# Patient Record
Sex: Female | Born: 1945 | Race: White | Hispanic: No | Marital: Single | Smoking: Never smoker
Health system: Southern US, Community
[De-identification: ages and names within clinical notes are randomized; demographics above are authoritative.]

## PROBLEM LIST (undated history)

## (undated) DIAGNOSIS — R0602 Shortness of breath: Secondary | ICD-10-CM

## (undated) DIAGNOSIS — I10 Essential (primary) hypertension: Secondary | ICD-10-CM

## (undated) DIAGNOSIS — E669 Obesity, unspecified: Secondary | ICD-10-CM

## (undated) DIAGNOSIS — C801 Malignant (primary) neoplasm, unspecified: Secondary | ICD-10-CM

## (undated) DIAGNOSIS — R42 Dizziness and giddiness: Secondary | ICD-10-CM

## (undated) DIAGNOSIS — E119 Type 2 diabetes mellitus without complications: Secondary | ICD-10-CM

## (undated) DIAGNOSIS — I2510 Atherosclerotic heart disease of native coronary artery without angina pectoris: Secondary | ICD-10-CM

## (undated) DIAGNOSIS — E039 Hypothyroidism, unspecified: Secondary | ICD-10-CM

## (undated) DIAGNOSIS — M1710 Unilateral primary osteoarthritis, unspecified knee: Secondary | ICD-10-CM

## (undated) DIAGNOSIS — E785 Hyperlipidemia, unspecified: Secondary | ICD-10-CM

## (undated) DIAGNOSIS — M179 Osteoarthritis of knee, unspecified: Secondary | ICD-10-CM

## (undated) HISTORY — PX: ABDOMINAL HYSTERECTOMY: SHX81

## (undated) HISTORY — PX: BREAST EXCISIONAL BIOPSY: SUR124

---

## 2004-01-11 ENCOUNTER — Ambulatory Visit: Payer: Self-pay | Admitting: Internal Medicine

## 2004-01-25 ENCOUNTER — Ambulatory Visit: Payer: Self-pay | Admitting: Internal Medicine

## 2005-02-12 ENCOUNTER — Ambulatory Visit: Payer: Self-pay | Admitting: Internal Medicine

## 2005-07-27 ENCOUNTER — Ambulatory Visit: Payer: Self-pay | Admitting: Gastroenterology

## 2005-08-28 ENCOUNTER — Ambulatory Visit: Payer: Self-pay

## 2006-02-15 ENCOUNTER — Ambulatory Visit: Payer: Self-pay

## 2006-03-25 ENCOUNTER — Ambulatory Visit: Payer: Self-pay | Admitting: Internal Medicine

## 2007-02-10 ENCOUNTER — Ambulatory Visit: Payer: Self-pay | Admitting: Internal Medicine

## 2007-03-28 ENCOUNTER — Ambulatory Visit: Payer: Self-pay | Admitting: Internal Medicine

## 2008-03-28 ENCOUNTER — Ambulatory Visit: Payer: Self-pay | Admitting: Internal Medicine

## 2008-12-03 ENCOUNTER — Ambulatory Visit: Payer: Self-pay | Admitting: Internal Medicine

## 2009-04-02 ENCOUNTER — Ambulatory Visit: Payer: Self-pay | Admitting: Internal Medicine

## 2009-05-29 ENCOUNTER — Ambulatory Visit: Payer: Self-pay | Admitting: Internal Medicine

## 2010-04-03 ENCOUNTER — Ambulatory Visit: Payer: Self-pay | Admitting: Internal Medicine

## 2011-03-13 ENCOUNTER — Ambulatory Visit: Payer: Self-pay | Admitting: Internal Medicine

## 2011-04-06 ENCOUNTER — Ambulatory Visit: Payer: Self-pay | Admitting: Internal Medicine

## 2012-04-15 ENCOUNTER — Ambulatory Visit: Payer: Self-pay | Admitting: Internal Medicine

## 2012-06-13 ENCOUNTER — Ambulatory Visit: Payer: Self-pay | Admitting: Internal Medicine

## 2013-04-17 ENCOUNTER — Ambulatory Visit: Payer: Self-pay | Admitting: Internal Medicine

## 2014-04-18 ENCOUNTER — Ambulatory Visit: Payer: Self-pay | Admitting: Internal Medicine

## 2015-04-04 ENCOUNTER — Encounter: Payer: Self-pay | Admitting: *Deleted

## 2015-04-05 ENCOUNTER — Ambulatory Visit: Payer: Medicare Other | Admitting: Anesthesiology

## 2015-04-05 ENCOUNTER — Encounter
Admission: RE | Disposition: A | Discharge status: Home / Self Care | Payer: Self-pay | Source: Ambulatory Visit | Attending: Gastroenterology

## 2015-04-05 ENCOUNTER — Encounter: Payer: Self-pay | Admitting: *Deleted

## 2015-04-05 ENCOUNTER — Ambulatory Visit
Admission: RE | Admit: 2015-04-05 | Discharge: 2015-04-05 | Disposition: A | Discharge status: Home / Self Care | Payer: Medicare Other | Source: Ambulatory Visit | Attending: Gastroenterology | Admitting: Gastroenterology

## 2015-04-05 DIAGNOSIS — Z7984 Long term (current) use of oral hypoglycemic drugs: Secondary | ICD-10-CM | POA: Insufficient documentation

## 2015-04-05 DIAGNOSIS — E039 Hypothyroidism, unspecified: Secondary | ICD-10-CM | POA: Insufficient documentation

## 2015-04-05 DIAGNOSIS — K529 Noninfective gastroenteritis and colitis, unspecified: Secondary | ICD-10-CM | POA: Diagnosis present

## 2015-04-05 DIAGNOSIS — E119 Type 2 diabetes mellitus without complications: Secondary | ICD-10-CM | POA: Insufficient documentation

## 2015-04-05 DIAGNOSIS — Z7982 Long term (current) use of aspirin: Secondary | ICD-10-CM | POA: Diagnosis not present

## 2015-04-05 DIAGNOSIS — I2510 Atherosclerotic heart disease of native coronary artery without angina pectoris: Secondary | ICD-10-CM | POA: Diagnosis not present

## 2015-04-05 DIAGNOSIS — Z6841 Body Mass Index (BMI) 40.0 and over, adult: Secondary | ICD-10-CM | POA: Insufficient documentation

## 2015-04-05 DIAGNOSIS — I10 Essential (primary) hypertension: Secondary | ICD-10-CM | POA: Insufficient documentation

## 2015-04-05 DIAGNOSIS — Z79899 Other long term (current) drug therapy: Secondary | ICD-10-CM | POA: Insufficient documentation

## 2015-04-05 DIAGNOSIS — D125 Benign neoplasm of sigmoid colon: Secondary | ICD-10-CM | POA: Insufficient documentation

## 2015-04-05 DIAGNOSIS — M19079 Primary osteoarthritis, unspecified ankle and foot: Secondary | ICD-10-CM | POA: Diagnosis not present

## 2015-04-05 DIAGNOSIS — K5730 Diverticulosis of large intestine without perforation or abscess without bleeding: Secondary | ICD-10-CM | POA: Diagnosis not present

## 2015-04-05 DIAGNOSIS — R42 Dizziness and giddiness: Secondary | ICD-10-CM | POA: Insufficient documentation

## 2015-04-05 DIAGNOSIS — D123 Benign neoplasm of transverse colon: Secondary | ICD-10-CM | POA: Insufficient documentation

## 2015-04-05 DIAGNOSIS — R0602 Shortness of breath: Secondary | ICD-10-CM | POA: Insufficient documentation

## 2015-04-05 DIAGNOSIS — Z90710 Acquired absence of both cervix and uterus: Secondary | ICD-10-CM | POA: Insufficient documentation

## 2015-04-05 DIAGNOSIS — E785 Hyperlipidemia, unspecified: Secondary | ICD-10-CM | POA: Insufficient documentation

## 2015-04-05 HISTORY — DX: Unilateral primary osteoarthritis, unspecified knee: M17.10

## 2015-04-05 HISTORY — DX: Obesity, unspecified: E66.9

## 2015-04-05 HISTORY — DX: Osteoarthritis of knee, unspecified: M17.9

## 2015-04-05 HISTORY — DX: Type 2 diabetes mellitus without complications: E11.9

## 2015-04-05 HISTORY — DX: Shortness of breath: R06.02

## 2015-04-05 HISTORY — DX: Hypothyroidism, unspecified: E03.9

## 2015-04-05 HISTORY — DX: Atherosclerotic heart disease of native coronary artery without angina pectoris: I25.10

## 2015-04-05 HISTORY — DX: Hyperlipidemia, unspecified: E78.5

## 2015-04-05 HISTORY — DX: Essential (primary) hypertension: I10

## 2015-04-05 HISTORY — DX: Dizziness and giddiness: R42

## 2015-04-05 HISTORY — PX: COLONOSCOPY WITH PROPOFOL: SHX5780

## 2015-04-05 LAB — GLUCOSE, CAPILLARY: GLUCOSE-CAPILLARY: 108 mg/dL — AB (ref 65–99)

## 2015-04-05 SURGERY — COLONOSCOPY WITH PROPOFOL
Anesthesia: General

## 2015-04-05 MED ORDER — PROPOFOL 10 MG/ML IV BOLUS
INTRAVENOUS | Status: DC | PRN
  Administered 2015-04-05: 50 mg via INTRAVENOUS

## 2015-04-05 MED ORDER — FENTANYL CITRATE (PF) 100 MCG/2ML IJ SOLN
INTRAMUSCULAR | Status: DC | PRN
  Administered 2015-04-05 (×4): 25 ug via INTRAVENOUS

## 2015-04-05 MED ORDER — SODIUM CHLORIDE 0.9 % IV SOLN
INTRAVENOUS | Status: DC
Start: 2015-04-05 — End: 2015-04-05
  Administered 2015-04-05: 08:00:00 via INTRAVENOUS

## 2015-04-05 MED ORDER — MIDAZOLAM HCL 2 MG/2ML IJ SOLN
INTRAMUSCULAR | Status: DC | PRN
  Administered 2015-04-05: 2 mg via INTRAVENOUS

## 2015-04-05 MED ORDER — PROPOFOL 500 MG/50ML IV EMUL
INTRAVENOUS | Status: DC | PRN
  Administered 2015-04-05: 100 ug/kg/min via INTRAVENOUS

## 2015-04-05 MED ORDER — PHENYLEPHRINE HCL 10 MG/ML IJ SOLN
INTRAMUSCULAR | Status: DC | PRN
  Administered 2015-04-05: 100 ug via INTRAVENOUS

## 2015-04-05 NOTE — H&P (Signed)
  Primary Care Physician:  Idelle Crouch, MD  Pre-Procedure History & Physical: HPI:  Amber Hines is a 69 y.o. female is here for an colonoscopy.   Past Medical History  Diagnosis Date  . Hypertension   . Hyperlipidemia   . SOB (shortness of breath)   . Dizziness and giddiness   . Osteoarthritis of knee   . Obesity   . Diabetes mellitus without complication ([place])   . Hypothyroidism   . Coronary artery disease     Past Surgical History  Procedure Laterality Date  . Abdominal hysterectomy      Prior to Admission medications   Medication Sig Start Date End Date Taking? Authorizing Provider  aspirin EC 81 MG tablet Take 81 mg by mouth daily.   Yes Historical Provider, MD  ezetimibe-simvastatin (VYTORIN) 10-20 MG tablet Take 1 tablet by mouth daily.   Yes Historical Provider, MD  levothyroxine (SYNTHROID, LEVOTHROID) 100 MCG tablet Take 100 mcg by mouth daily before breakfast.   Yes Historical Provider, MD  losartan-hydrochlorothiazide (HYZAAR) 50-12.5 MG tablet Take 1 tablet by mouth daily.   Yes Historical Provider, MD  metFORMIN (GLUCOPHAGE) 500 MG tablet Take by mouth daily.   Yes Historical Provider, MD  metoprolol succinate (TOPROL-XL) 50 MG 24 hr tablet Take 50 mg by mouth daily. Take with or immediately following a meal.   Yes Historical Provider, MD  omeprazole (PRILOSEC) 40 MG capsule Take 40 mg by mouth daily.   Yes Historical Provider, MD  pioglitazone (ACTOS) 15 MG tablet Take 15 mg by mouth daily.   Yes Historical Provider, MD    Allergies as of 02/27/2015  . (Not on File)    History reviewed. No pertinent family history.  Social History   Social History  . Marital Status: Single    Spouse Name: N/A  . Number of Children: N/A  . Years of Education: N/A   Occupational History  . Not on file.   Social History Main Topics  . Smoking status: Never Smoker   . Smokeless tobacco: Never Used  . Alcohol Use: No  . Drug Use: No  . Sexual Activity: Not  on file   Other Topics Concern  . Not on file   Social History Narrative     Physical Exam: BP 153/89 mmHg  Pulse 96  Temp(Src) 95.8 F (35.4 C) (Tympanic)  Resp 18  Ht 5\' 4"  (1.626 m)  Wt 110.678 kg (244 lb)  BMI 41.86 kg/m2  SpO2 100% General:   Alert,  pleasant and cooperative in NAD Head:  Normocephalic and atraumatic. Neck:  Supple; no masses or thyromegaly. Lungs:  Clear throughout to auscultation.    Heart:  Regular rate and rhythm. Abdomen:  Soft, nontender and nondistended. Normal bowel sounds, without guarding, and without rebound.   Neurologic:  Alert and  oriented x4;  grossly normal neurologically.  Impression/Plan: Amber Hines is here for an colonoscopy to be performed for chronic diarrhea.  Risks, benefits, limitations, and alternatives regarding  colonoscopy have been reviewed with the patient.  Questions have been answered.  All parties agreeable.   Josefine Class, MD  04/05/2015, 8:57 AM

## 2015-04-05 NOTE — Anesthesia Postprocedure Evaluation (Signed)
Anesthesia Post Note  Patient: Amber Hines  Procedure(s) Performed: Procedure(s) (LRB): COLONOSCOPY WITH PROPOFOL (N/A)  Patient location during evaluation: Endoscopy Anesthesia Type: General Level of consciousness: awake and alert Pain management: pain level controlled Vital Signs Assessment: post-procedure vital signs reviewed and stable Respiratory status: spontaneous breathing, nonlabored ventilation, respiratory function stable and patient connected to nasal cannula oxygen Cardiovascular status: blood pressure returned to baseline and stable Postop Assessment: no signs of nausea or vomiting Anesthetic complications: no    Last Vitals:  Filed Vitals:   04/05/15 1000 04/05/15 1010  BP: 113/77 124/80  Pulse: 65 58  Temp:    Resp: 14 15    Last Pain: There were no vitals filed for this visit.               Precious Haws Piscitello

## 2015-04-05 NOTE — Op Note (Signed)
[facility] Gastroenterology Patient Name: Amber Hines Procedure Date: 04/05/2015 8:59 AM MRN: [id number] Account #: [id number] Date of Birth: 1945/10/18 Admit Type: Outpatient Age: 69 Room: [facility] ENDO ROOM 1 Gender: Female Note Status: Finalized Procedure:         Colonoscopy Indications:       Last colonoscopy 10 years ago, Chronic diarrhea Patient Profile:   This is a 69 year old female. Providers:         Gerrit Heck. Rayann Heman, MD Referring MD:      Leonie Douglas. Doy Hutching, MD (Referring MD) Medicines:         Propofol per Anesthesia Complications:     No immediate complications. Procedure:         Pre-Anesthesia Assessment:                    - Prior to the procedure, a History and Physical was                     performed, and patient medications, allergies and                     sensitivities were reviewed. The patient's tolerance of                     previous anesthesia was reviewed.                    After obtaining informed consent, the colonoscope was                     passed under direct vision. Throughout the procedure, the                     patient's blood pressure, pulse, and oxygen saturations                     were monitored continuously. The Colonoscope was                     introduced through the anus and advanced to the the                     terminal ileum. The colonoscopy was performed without                     difficulty. The patient tolerated the procedure well. The                     quality of the bowel preparation was good. Findings:      The perianal and digital rectal examinations were normal.      A 4 mm polyp was found in the transverse colon. The polyp was sessile.       The polyp was removed with a cold snare. Resection and retrieval were       complete.      A 4 mm polyp was found in the sigmoid colon. The polyp was sessile. The       polyp was removed with a cold snare. Resection and retrieval were       complete.    A 3 mm polyp was found in the distal sigmoid colon. The polyp was       sessile. The polyp was removed with a jumbo cold forceps. Resection and       retrieval were complete.  A few small-mouthed diverticula were found in the sigmoid colon.      Biopsies for histology were taken with a cold forceps from the right       colon, left colon and rectum for evaluation of microscopic colitis.      The exam was otherwise without abnormality on direct and retroflexion       views.      The terminal ileum appeared normal. Impression:        - One 4 mm polyp in the transverse colon. Resected and                     retrieved.                    - One 4 mm polyp in the sigmoid colon. Resected and                     retrieved.                    - One 3 mm polyp in the distal sigmoid colon. Resected and                     retrieved.                    - Diverticulosis in the sigmoid colon.                    - The examination was otherwise normal on direct and                     retroflexion views.                    - The examined portion of the ileum was normal.                    - Biopsies were taken with a cold forceps from the right                     colon, left colon and rectum for evaluation of microscopic                     colitis. Recommendation:    - Observe patient in GI recovery unit.                    - Resume regular diet.                    - Continue present medications.                    - Await pathology results.                    - Repeat colonoscopy for surveillance based on pathology                     results.                    - Continue low fodmap diet and probiotic                    - Call GI clinic if stool frequency worsens.                    -  Return to referring physician.                    - The findings and recommendations were discussed with the                     patient.                    - The findings and recommendations were discussed  with the                     patient's family. Procedure Code(s): --- Professional ---                    [phone], Colonoscopy, flexible; with removal of tumor(s),                     polyp(s), or other lesion(s) by snare technique                    45380, 22, Colonoscopy, flexible; with biopsy, single or                     multiple Diagnosis Code(s): --- Professional ---                    D12.3, Benign neoplasm of transverse colon                    D12.5, Benign neoplasm of sigmoid colon                    K52.9, Noninfective gastroenteritis and colitis,                     unspecified                    K57.30, Diverticulosis of large intestine without                     perforation or abscess without bleeding CPT copyright 2014 American Medical Association. All rights reserved. The codes documented in this report are preliminary and upon coder review may  be revised to meet current compliance requirements. Mellody Life, MD 04/05/2015 9:31:37 AM This report has been signed electronically. Number of Addenda: 0 Note Initiated On: 04/05/2015 8:59 AM Scope Withdrawal Time: 0 hours 14 minutes 44 seconds  Total Procedure Duration: 0 hours 20 minutes 24 seconds       [facility]

## 2015-04-05 NOTE — OR Nursing (Signed)
BP 64/34 ON ARRIVAL . Ebony crna adm meds.

## 2015-04-05 NOTE — Discharge Instructions (Signed)

## 2015-04-05 NOTE — Transfer of Care (Signed)
Immediate Anesthesia Transfer of Care Note  Patient: Amber Hines  Procedure(s) Performed: Procedure(s): COLONOSCOPY WITH PROPOFOL (N/A)  Patient Location: PACU  Anesthesia Type:General  Level of Consciousness: awake, alert  and oriented  Airway & Oxygen Therapy: Patient Spontanous Breathing and Patient connected to nasal cannula oxygen  Post-op Assessment: Report given to RN and Post -op Vital signs reviewed and stable  Post vital signs: Reviewed and stable  Last Vitals:  Filed Vitals:   04/05/15 0806 04/05/15 0931  BP: 153/89   Pulse: 96   Temp: 35.4 C 35.7 C  Resp: 18     Complications: No apparent anesthesia complications

## 2015-04-05 NOTE — Anesthesia Preprocedure Evaluation (Signed)
Anesthesia Evaluation  Patient identified by MRN, date of birth, ID band Patient awake    Reviewed: Allergy & Precautions, H&P , NPO status , Patient's Chart, lab work & pertinent test results  History of Anesthesia Complications Negative for: history of anesthetic complications  Airway Mallampati: III  TM Distance: >3 FB Neck ROM: limited    Dental no notable dental hx. (+) Teeth Intact   Pulmonary neg pulmonary ROS, neg shortness of breath,    Pulmonary exam normal breath sounds clear to auscultation       Cardiovascular Exercise Tolerance: Good hypertension, (-) angina+ CAD  (-) Past MI and (-) DOE Normal cardiovascular exam Rhythm:regular Rate:Normal     Neuro/Psych negative neurological ROS  negative psych ROS   GI/Hepatic negative GI ROS, Neg liver ROS,   Endo/Other  diabetes, Type 2Hypothyroidism Morbid obesity  Renal/GU negative Renal ROS  negative genitourinary   Musculoskeletal   Abdominal   Peds  Hematology negative hematology ROS (+)   Anesthesia Other Findings Past Medical History:   Hypertension                                                 Hyperlipidemia                                               SOB (shortness of breath)                                    Dizziness and giddiness                                      Osteoarthritis of knee                                       Obesity                                                      Diabetes mellitus without complication (HCC)                 Hypothyroidism                                               Coronary artery disease                                     Past Surgical History:   ABDOMINAL HYSTERECTOMY  BMI    Body Mass Index   41.86 kg/m 2    Signs and symptoms suggestive of sleep apnea    Reproductive/Obstetrics negative OB ROS                              Anesthesia Physical Anesthesia Plan  ASA: III  Anesthesia Plan: General   Post-op Pain Management:    Induction:   Airway Management Planned:   Additional Equipment:   Intra-op Plan:   Post-operative Plan:   Informed Consent: I have reviewed the patients History and Physical, chart, labs and discussed the procedure including the risks, benefits and alternatives for the proposed anesthesia with the patient or authorized representative who has indicated his/her understanding and acceptance.   Dental Advisory Given  Plan Discussed with: Anesthesiologist, CRNA and Surgeon  Anesthesia Plan Comments:         Anesthesia Quick Evaluation

## 2015-04-09 ENCOUNTER — Other Ambulatory Visit: Payer: Self-pay | Admitting: Internal Medicine

## 2015-04-09 DIAGNOSIS — Z1231 Encounter for screening mammogram for malignant neoplasm of breast: Secondary | ICD-10-CM

## 2015-04-09 LAB — SURGICAL PATHOLOGY

## 2015-04-10 ENCOUNTER — Encounter: Payer: Self-pay | Admitting: Gastroenterology

## 2015-04-22 ENCOUNTER — Ambulatory Visit
Admission: RE | Admit: 2015-04-22 | Discharge: 2015-04-22 | Disposition: A | Discharge status: Home / Self Care | Payer: Medicare Other | Source: Ambulatory Visit | Attending: Internal Medicine | Admitting: Internal Medicine

## 2015-04-22 DIAGNOSIS — Z1231 Encounter for screening mammogram for malignant neoplasm of breast: Secondary | ICD-10-CM | POA: Diagnosis not present

## 2015-04-22 HISTORY — DX: Malignant (primary) neoplasm, unspecified: C80.1

## 2016-03-10 ENCOUNTER — Other Ambulatory Visit: Payer: Self-pay | Admitting: Internal Medicine

## 2016-03-10 DIAGNOSIS — Z1231 Encounter for screening mammogram for malignant neoplasm of breast: Secondary | ICD-10-CM

## 2016-04-23 ENCOUNTER — Ambulatory Visit: Payer: Medicare Other

## 2016-05-20 ENCOUNTER — Ambulatory Visit
Admission: RE | Admit: 2016-05-20 | Discharge: 2016-05-20 | Disposition: A | Discharge status: Home / Self Care | Payer: Medicare HMO | Source: Ambulatory Visit | Attending: Internal Medicine | Admitting: Internal Medicine

## 2016-05-20 DIAGNOSIS — Z1231 Encounter for screening mammogram for malignant neoplasm of breast: Secondary | ICD-10-CM | POA: Diagnosis present

## 2017-03-26 ENCOUNTER — Other Ambulatory Visit: Payer: Self-pay | Admitting: Internal Medicine

## 2017-03-26 DIAGNOSIS — Z1231 Encounter for screening mammogram for malignant neoplasm of breast: Secondary | ICD-10-CM

## 2017-05-24 ENCOUNTER — Ambulatory Visit
Admission: RE | Admit: 2017-05-24 | Discharge: 2017-05-24 | Disposition: A | Discharge status: Home / Self Care | Payer: Medicare HMO | Source: Ambulatory Visit | Attending: Internal Medicine | Admitting: Internal Medicine

## 2017-05-24 DIAGNOSIS — R928 Other abnormal and inconclusive findings on diagnostic imaging of breast: Secondary | ICD-10-CM | POA: Diagnosis not present

## 2017-05-24 DIAGNOSIS — Z1231 Encounter for screening mammogram for malignant neoplasm of breast: Secondary | ICD-10-CM | POA: Insufficient documentation

## 2017-05-26 ENCOUNTER — Other Ambulatory Visit: Payer: Self-pay | Admitting: Internal Medicine

## 2017-05-26 DIAGNOSIS — N6310 Unspecified lump in the right breast, unspecified quadrant: Secondary | ICD-10-CM

## 2017-05-26 DIAGNOSIS — R928 Other abnormal and inconclusive findings on diagnostic imaging of breast: Secondary | ICD-10-CM

## 2017-06-03 ENCOUNTER — Ambulatory Visit
Admission: RE | Admit: 2017-06-03 | Discharge: 2017-06-03 | Disposition: A | Discharge status: Home / Self Care | Payer: Medicare HMO | Source: Ambulatory Visit | Attending: Internal Medicine | Admitting: Internal Medicine

## 2017-06-03 DIAGNOSIS — N6310 Unspecified lump in the right breast, unspecified quadrant: Secondary | ICD-10-CM | POA: Insufficient documentation

## 2017-06-03 DIAGNOSIS — R928 Other abnormal and inconclusive findings on diagnostic imaging of breast: Secondary | ICD-10-CM | POA: Diagnosis present

## 2018-05-09 ENCOUNTER — Other Ambulatory Visit: Payer: Self-pay | Admitting: Internal Medicine

## 2018-05-09 DIAGNOSIS — N6310 Unspecified lump in the right breast, unspecified quadrant: Secondary | ICD-10-CM

## 2018-05-25 ENCOUNTER — Ambulatory Visit
Admission: RE | Admit: 2018-05-25 | Discharge: 2018-05-25 | Disposition: A | Discharge status: Home / Self Care | Payer: Medicare HMO | Source: Ambulatory Visit | Attending: Internal Medicine | Admitting: Internal Medicine

## 2018-05-25 DIAGNOSIS — N6489 Other specified disorders of breast: Secondary | ICD-10-CM | POA: Diagnosis not present

## 2018-05-25 DIAGNOSIS — R922 Inconclusive mammogram: Secondary | ICD-10-CM | POA: Diagnosis not present

## 2018-05-25 DIAGNOSIS — N6310 Unspecified lump in the right breast, unspecified quadrant: Secondary | ICD-10-CM

## 2019-04-25 ENCOUNTER — Other Ambulatory Visit: Payer: Self-pay | Admitting: Internal Medicine

## 2019-04-25 DIAGNOSIS — N63 Unspecified lump in unspecified breast: Secondary | ICD-10-CM

## 2019-04-25 DIAGNOSIS — Z1231 Encounter for screening mammogram for malignant neoplasm of breast: Secondary | ICD-10-CM

## 2019-05-29 ENCOUNTER — Ambulatory Visit
Admission: RE | Admit: 2019-05-29 | Discharge: 2019-05-29 | Disposition: A | Discharge status: Home / Self Care | Payer: Medicare HMO | Source: Ambulatory Visit | Attending: Internal Medicine | Admitting: Internal Medicine

## 2019-05-29 DIAGNOSIS — N6315 Unspecified lump in the right breast, overlapping quadrants: Secondary | ICD-10-CM | POA: Diagnosis not present

## 2019-05-29 DIAGNOSIS — N63 Unspecified lump in unspecified breast: Secondary | ICD-10-CM

## 2019-05-29 DIAGNOSIS — Z1231 Encounter for screening mammogram for malignant neoplasm of breast: Secondary | ICD-10-CM

## 2019-07-06 ENCOUNTER — Other Ambulatory Visit: Payer: Self-pay

## 2019-07-06 ENCOUNTER — Emergency Department
Admission: EM | Admit: 2019-07-06 | Discharge: 2019-07-06 | Disposition: A | Discharge status: Home / Self Care | Payer: Medicare HMO | Attending: Emergency Medicine | Admitting: Emergency Medicine

## 2019-07-06 DIAGNOSIS — E119 Type 2 diabetes mellitus without complications: Secondary | ICD-10-CM | POA: Insufficient documentation

## 2019-07-06 DIAGNOSIS — Z7982 Long term (current) use of aspirin: Secondary | ICD-10-CM | POA: Diagnosis not present

## 2019-07-06 DIAGNOSIS — I10 Essential (primary) hypertension: Secondary | ICD-10-CM | POA: Insufficient documentation

## 2019-07-06 DIAGNOSIS — Z7984 Long term (current) use of oral hypoglycemic drugs: Secondary | ICD-10-CM | POA: Diagnosis not present

## 2019-07-06 DIAGNOSIS — R55 Syncope and collapse: Secondary | ICD-10-CM | POA: Diagnosis not present

## 2019-07-06 DIAGNOSIS — Z79899 Other long term (current) drug therapy: Secondary | ICD-10-CM | POA: Diagnosis not present

## 2019-07-06 LAB — CBC
HCT: 39.3 % (ref 36.0–46.0)
Hemoglobin: 13.2 g/dL (ref 12.0–15.0)
MCH: 30.4 pg (ref 26.0–34.0)
MCHC: 33.6 g/dL (ref 30.0–36.0)
MCV: 90.6 fL (ref 80.0–100.0)
Platelets: 204 10*3/uL (ref 150–400)
RBC: 4.34 MIL/uL (ref 3.87–5.11)
RDW: 13.2 % (ref 11.5–15.5)
WBC: 6.1 10*3/uL (ref 4.0–10.5)
nRBC: 0 % (ref 0.0–0.2)

## 2019-07-06 LAB — BASIC METABOLIC PANEL
Anion gap: 12 (ref 5–15)
BUN: 10 mg/dL (ref 8–23)
CO2: 27 mmol/L (ref 22–32)
Calcium: 9 mg/dL (ref 8.9–10.3)
Chloride: 96 mmol/L — ABNORMAL LOW (ref 98–111)
Creatinine, Ser: 0.89 mg/dL (ref 0.44–1.00)
GFR calc Af Amer: >60 mL/min (ref >60)
GFR calc non Af Amer: >60 mL/min (ref >60)
Glucose, Bld: 153 mg/dL — ABNORMAL HIGH (ref 70–99)
Potassium: 3.8 mmol/L (ref 3.5–5.1)
Sodium: 135 mmol/L (ref 135–145)

## 2019-07-06 MED ORDER — SODIUM CHLORIDE 0.9% FLUSH: 3.0000 mL | Freq: Once | INTRAVENOUS | Status: DC

## 2019-07-06 NOTE — ED Notes (Signed)
Patient's friend reports that another person who witnessed the event described it as a seizure. Patient reports she did have fecal incontinence during syncopal episode. Did not bite tongue. Dr. Jacqualine Code confirmed ok for DC.

## 2019-07-06 NOTE — Discharge Instructions (Signed)
Please follow-up closely Dr. Doy Hutching.  I would recommend you stop taking your Hyzaar as your blood pressures are on the low range today, and this could have contributed to your fainting episode today.  Please check your blood pressures at home regularly and follow-up closely with Dr. Doy Hutching.

## 2019-07-06 NOTE — ED Triage Notes (Signed)
Pt reports that she was in line to be seated at a restaurant and got hot - pt states she got flushed, felt lightheaded, and passed out - reported to be passed out x1 minute and then aroused without difficulty - pt did vomit during episode - pt has hx of same but never seen for it

## 2019-07-06 NOTE — ED Provider Notes (Signed)
[facility] Emergency Department Provider Note   ____________________________________________   First MD Initiated Contact with Patient 07/06/19 1613     (approximate)  I have reviewed the triage vital signs and the nursing notes.   HISTORY  Chief Complaint Loss of Consciousness    HPI Amber Hines is a 74 y.o. female here for evaluation after she passed out  Patient reports that she had a been standing, she had just gotten seated to have food and started feeling lightheaded and then passed out.  She did not fall or become injured.  Friends at the table grabbed her and helped her up.  This lasted just briefly.  She recovered quickly thereafter.  She did not bite her tongue.  She did not urinate herself.  She denies having a seizure  She reports is, thing is happened to her several times over the last many years but she is never seen a doctor for it.  She has passed out before usually associated with feeling hot and standing in line  She denies any ongoing pain or symptoms.  Reports she feels perfectly fine now would just like something to eat.  She does take blood pressure medicines that she has been on for years, and sees Dr. Doy Hutching  Had no chest pain no trouble breathing.  No skipped beats nothing out of the ordinary.  Feeling completely fine now and felt completely fine earlier today and till the event occurred   Past Medical History:  Diagnosis Date  . Cancer ([place])    melanoma  . Coronary artery disease   . Diabetes mellitus without complication ([place])   . Dizziness and giddiness   . Hyperlipidemia   . Hypertension   . Hypothyroidism   . Obesity   . Osteoarthritis of knee   . SOB (shortness of breath)     There are no problems to display for this patient.   Past Surgical History:  Procedure Laterality Date  . ABDOMINAL HYSTERECTOMY    . BREAST EXCISIONAL BIOPSY Right    neg  . COLONOSCOPY WITH PROPOFOL N/A 04/05/2015   Procedure: COLONOSCOPY WITH PROPOFOL;  Surgeon: Josefine Class, MD;  Location: [facility] ENDOSCOPY;  Service: Endoscopy;  Laterality: N/A;    Prior to Admission medications   Medication Sig Start Date End Date Taking? Authorizing Provider  aspirin EC 81 MG tablet Take 81 mg by mouth daily.    [provider]  ezetimibe-simvastatin (VYTORIN) 10-20 MG tablet Take 1 tablet by mouth daily.    [provider]  levothyroxine (SYNTHROID, LEVOTHROID) 100 MCG tablet Take 100 mcg by mouth daily before breakfast.    [provider]  metFORMIN (GLUCOPHAGE) 500 MG tablet Take by mouth daily.    [provider]  metoprolol succinate (TOPROL-XL) 50 MG 24 hr tablet Take 50 mg by mouth daily. Take with or immediately following a meal.    [provider]  omeprazole (PRILOSEC) 40 MG capsule Take 40 mg by mouth daily.    [provider]  pioglitazone (ACTOS) 15 MG tablet Take 15 mg by mouth daily.    [provider]  losartan-hydrochlorothiazide (HYZAAR) 50-12.5 MG tablet Take 1 tablet by mouth daily.  07/06/19  [provider]    Allergies Patient has no known allergies.  Family History  Problem Relation Age of Onset  . Breast cancer Neg Hx     Social History Social History   Tobacco Use  . Smoking status: Never Smoker  .  Smokeless tobacco: Never Used  Substance Use Topics  . Alcohol use: No  . Drug use: No    Review of Systems Constitutional: No fever/chills.  See HPI Eyes: No visual changes. ENT: No sore throat. Cardiovascular: Denies chest pain. Respiratory: Denies shortness of breath. Gastrointestinal: No abdominal pain.   Genitourinary: Negative for dysuria. Musculoskeletal: Negative for back pain. Skin: Negative for rash. Neurological: Negative for headaches, areas of focal weakness or numbness.  See HPI regarding having passed out    ____________________________________________   PHYSICAL EXAM:  VITAL  SIGNS: ED Triage Vitals [07/06/19 1314]  Enc Vitals Group     BP (!) 104/59     Pulse Rate 74     Resp 16     Temp 98 F (36.7 C)     Temp Source Oral     SpO2 96 %     Weight 260 lb (117.9 kg)     Height 5\' 3"  (1.6 m)     Head Circumference      Peak Flow      Pain Score 0     Pain Loc      Pain Edu?      Excl. in [place]?     Constitutional: Alert and oriented. Well appearing and in no acute distress. Eyes: Conjunctivae are normal. Head: Atraumatic.  No neck pain. Nose: No congestion/rhinnorhea. Mouth/Throat: Mucous membranes are moist. Neck: No stridor.  Cardiovascular: Normal rate, regular rhythm. Grossly normal heart sounds.  Good peripheral circulation. Respiratory: Normal respiratory effort.  No retractions. Lungs CTAB. Gastrointestinal: Soft and nontender. No distention. Musculoskeletal: No lower extremity tenderness nor edema. Neurologic:  Normal speech and language. No gross focal neurologic deficits are appreciated.  Skin:  Skin is warm, dry and intact. No rash noted. Psychiatric: Mood and affect are normal. Speech and behavior are normal.  ____________________________________________   LABS (all labs ordered are listed, but only abnormal results are displayed)  Labs Reviewed  BASIC METABOLIC PANEL - Abnormal; Notable for the following components:      Result Value   Chloride 96 (*)    Glucose, Bld 153 (*)    All other components within normal limits  CBC  URINALYSIS, COMPLETE (UACMP) WITH MICROSCOPIC   ____________________________________________  EKG  Reviewed entered by me at 1320 Heart rate 70 QRS 99 QTc 440 Normal sinus rhythm, no evidence of acute ischemia.  Prominent Q wave in V3. ____________________________________________  RADIOLOGY   ____________________________________________   PROCEDURES  Procedure(s) performed: None  Procedures  Critical Care performed: No  ____________________________________________   INITIAL IMPRESSION  / ASSESSMENT AND PLAN / ED COURSE  Pertinent labs & imaging results that were available during my care of the patient were reviewed by me and considered in my medical decision making (see chart for details).   Patient's after evaluation of what appears to be a syncopal episode.  She reports same in the past multiple times.  She is fully recovered neurologically intact.  No evidence or report of trauma.  Does not sound like she had a seizure.  She was standing and shortly thereafter sat down and got very lightheaded felt warm and hot characteristic of her previous episodes of passing out as well for which she is never seen a doctor  Her lab work and evaluation here quite reassuring.  She does have some mild hypotension, but is on antihypertensives and is currently asymptomatic.  Orthostatics checked, blood pressure stable but in the 90s; but she is asymptomatic  ----------------------------------------- 5:12 PM  on 07/06/2019 -----------------------------------------  Patient eating a meal, reports she feels well no concerns.  We will stop her Hyzaar medication as she does have some mild hypotension here, I suspect this may have contributed possibly to her fainting.  Very reassuring examination, asymptomatic.  Patient comfortable this plan, family coming to drive her home.  Return precautions and treatment recommendations and follow-up discussed with the patient who is agreeable with the plan.       ____________________________________________   FINAL CLINICAL IMPRESSION(S) / ED DIAGNOSES  Final diagnoses:  Syncope and collapse        Note:  This document was prepared using Dragon voice recognition software and may include unintentional dictation errors       Delman Kitten, MD 07/06/19 1712

## 2020-01-29 ENCOUNTER — Ambulatory Visit: Payer: Medicare HMO | Attending: Internal Medicine

## 2020-01-29 DIAGNOSIS — Z23 Encounter for immunization: Secondary | ICD-10-CM

## 2020-01-29 NOTE — Progress Notes (Signed)
° °  Covid-19 Vaccination Clinic  Name:  YUKI BRUNSMAN    MRN: [id number] DOB: [birth year]  01/29/2020  Ms. Federer was observed post Covid-19 immunization for 15 minutes without incident. She was provided with Vaccine Information Sheet and instruction to access the V-Safe system.   Ms. Heuberger was instructed to call 911 with any severe reactions post vaccine:  Difficulty breathing   Swelling of face and throat   A fast heartbeat   A bad rash all over body   Dizziness and weakness

## 2020-03-26 ENCOUNTER — Other Ambulatory Visit: Payer: Self-pay | Admitting: Internal Medicine

## 2020-03-26 DIAGNOSIS — R112 Nausea with vomiting, unspecified: Secondary | ICD-10-CM

## 2020-03-26 DIAGNOSIS — N179 Acute kidney failure, unspecified: Secondary | ICD-10-CM

## 2020-03-26 DIAGNOSIS — R197 Diarrhea, unspecified: Secondary | ICD-10-CM

## 2020-03-27 ENCOUNTER — Other Ambulatory Visit: Payer: Self-pay

## 2020-03-27 ENCOUNTER — Ambulatory Visit
Admission: RE | Admit: 2020-03-27 | Discharge: 2020-03-27 | Disposition: A | Discharge status: Home / Self Care | Payer: Medicare HMO | Source: Ambulatory Visit | Attending: Internal Medicine | Admitting: Internal Medicine

## 2020-03-27 DIAGNOSIS — R197 Diarrhea, unspecified: Secondary | ICD-10-CM | POA: Diagnosis present

## 2020-03-27 DIAGNOSIS — N179 Acute kidney failure, unspecified: Secondary | ICD-10-CM

## 2020-03-27 DIAGNOSIS — R112 Nausea with vomiting, unspecified: Secondary | ICD-10-CM

## 2020-04-19 ENCOUNTER — Other Ambulatory Visit: Payer: Self-pay | Admitting: Family Medicine

## 2020-04-19 ENCOUNTER — Other Ambulatory Visit: Payer: Self-pay

## 2020-04-19 ENCOUNTER — Ambulatory Visit
Admission: RE | Admit: 2020-04-19 | Discharge: 2020-04-19 | Disposition: A | Discharge status: Home / Self Care | Payer: Medicare HMO | Source: Ambulatory Visit | Attending: Family Medicine | Admitting: Family Medicine

## 2020-04-19 DIAGNOSIS — S0990XA Unspecified injury of head, initial encounter: Secondary | ICD-10-CM

## 2020-04-19 DIAGNOSIS — W19XXXA Unspecified fall, initial encounter: Secondary | ICD-10-CM | POA: Diagnosis not present

## 2020-04-19 DIAGNOSIS — I6782 Cerebral ischemia: Secondary | ICD-10-CM | POA: Diagnosis not present

## 2020-05-27 ENCOUNTER — Other Ambulatory Visit: Payer: Self-pay

## 2020-05-27 ENCOUNTER — Other Ambulatory Visit
Admission: RE | Admit: 2020-05-27 | Discharge: 2020-05-27 | Disposition: A | Discharge status: Home / Self Care | Payer: Medicare HMO | Source: Ambulatory Visit | Attending: Internal Medicine | Admitting: Internal Medicine

## 2020-05-27 DIAGNOSIS — Z01812 Encounter for preprocedural laboratory examination: Secondary | ICD-10-CM | POA: Diagnosis present

## 2020-05-27 DIAGNOSIS — Z20822 Contact with and (suspected) exposure to covid-19: Secondary | ICD-10-CM | POA: Insufficient documentation

## 2020-05-27 LAB — SARS CORONAVIRUS 2 (TAT 6-24 HRS): SARS Coronavirus 2: NEGATIVE

## 2020-05-27 MED ORDER — LIDOCAINE HCL (PF) 2 % IJ SOLN
INTRAMUSCULAR | Status: AC
  Filled 2020-05-27: qty 5

## 2020-05-27 MED ORDER — PROPOFOL 500 MG/50ML IV EMUL
INTRAVENOUS | Status: AC
  Filled 2020-05-27: qty 50

## 2020-05-28 ENCOUNTER — Encounter: Payer: Self-pay | Admitting: Internal Medicine

## 2020-05-29 ENCOUNTER — Ambulatory Visit: Payer: Medicare HMO | Admitting: Certified Registered Nurse Anesthetist

## 2020-05-29 ENCOUNTER — Encounter: Payer: Self-pay | Admitting: Internal Medicine

## 2020-05-29 ENCOUNTER — Other Ambulatory Visit: Payer: Self-pay

## 2020-05-29 ENCOUNTER — Ambulatory Visit
Admission: RE | Admit: 2020-05-29 | Discharge: 2020-05-29 | Disposition: A | Discharge status: Home / Self Care | Payer: Medicare HMO | Attending: Internal Medicine | Admitting: Internal Medicine

## 2020-05-29 ENCOUNTER — Encounter
Admission: RE | Disposition: A | Discharge status: Home / Self Care | Payer: Self-pay | Source: Home / Self Care | Attending: Internal Medicine

## 2020-05-29 DIAGNOSIS — E119 Type 2 diabetes mellitus without complications: Secondary | ICD-10-CM | POA: Diagnosis not present

## 2020-05-29 DIAGNOSIS — K449 Diaphragmatic hernia without obstruction or gangrene: Secondary | ICD-10-CM | POA: Insufficient documentation

## 2020-05-29 DIAGNOSIS — Z7984 Long term (current) use of oral hypoglycemic drugs: Secondary | ICD-10-CM | POA: Diagnosis not present

## 2020-05-29 DIAGNOSIS — K640 First degree hemorrhoids: Secondary | ICD-10-CM | POA: Insufficient documentation

## 2020-05-29 DIAGNOSIS — D175 Benign lipomatous neoplasm of intra-abdominal organs: Secondary | ICD-10-CM | POA: Diagnosis not present

## 2020-05-29 DIAGNOSIS — Z7982 Long term (current) use of aspirin: Secondary | ICD-10-CM | POA: Diagnosis not present

## 2020-05-29 DIAGNOSIS — K2100 Gastro-esophageal reflux disease with esophagitis, without bleeding: Secondary | ICD-10-CM | POA: Diagnosis not present

## 2020-05-29 DIAGNOSIS — K5730 Diverticulosis of large intestine without perforation or abscess without bleeding: Secondary | ICD-10-CM | POA: Diagnosis not present

## 2020-05-29 DIAGNOSIS — Z1211 Encounter for screening for malignant neoplasm of colon: Secondary | ICD-10-CM | POA: Insufficient documentation

## 2020-05-29 DIAGNOSIS — Z86010 Personal history of colonic polyps: Secondary | ICD-10-CM | POA: Diagnosis not present

## 2020-05-29 HISTORY — PX: COLONOSCOPY WITH PROPOFOL: SHX5780

## 2020-05-29 HISTORY — PX: ESOPHAGOGASTRODUODENOSCOPY (EGD) WITH PROPOFOL: SHX5813

## 2020-05-29 SURGERY — COLONOSCOPY WITH PROPOFOL
Anesthesia: General

## 2020-05-29 MED ORDER — SODIUM CHLORIDE 0.9 % IV SOLN: INTRAVENOUS | Status: DC

## 2020-05-29 MED ORDER — PROPOFOL 500 MG/50ML IV EMUL
INTRAVENOUS | Status: DC | PRN
  Administered 2020-05-29: 160 ug/kg/min via INTRAVENOUS

## 2020-05-29 MED ORDER — GLYCOPYRROLATE 0.2 MG/ML IJ SOLN
INTRAMUSCULAR | Status: AC
  Filled 2020-05-29: qty 1

## 2020-05-29 MED ORDER — PROPOFOL 500 MG/50ML IV EMUL
INTRAVENOUS | Status: AC
  Filled 2020-05-29: qty 250

## 2020-05-29 MED ORDER — LIDOCAINE HCL (PF) 2 % IJ SOLN
INTRAMUSCULAR | Status: AC
  Filled 2020-05-29: qty 15

## 2020-05-29 MED ORDER — PROPOFOL 10 MG/ML IV BOLUS
INTRAVENOUS | Status: DC | PRN
  Administered 2020-05-29 (×2): 20 mg via INTRAVENOUS
  Administered 2020-05-29: 60 mg via INTRAVENOUS

## 2020-05-29 MED ORDER — LIDOCAINE HCL (CARDIAC) PF 100 MG/5ML IV SOSY
PREFILLED_SYRINGE | INTRAVENOUS | Status: DC | PRN
  Administered 2020-05-29: 100 mg via INTRAVENOUS

## 2020-05-29 MED ORDER — EPHEDRINE SULFATE 50 MG/ML IJ SOLN
INTRAMUSCULAR | Status: DC | PRN
  Administered 2020-05-29: 10 mg via INTRAVENOUS

## 2020-05-29 NOTE — H&P (Signed)
Outpatient short stay form Pre-procedure 05/29/2020 8:21 AM Teodoro K. Alice Reichert, M.D.  Primary Physician: Fulton Reek, M.D.  Reason for visit:  Personal history of adenomatous and serrated colon polyps (2016)  History of present illness:                            Patient presents for colonoscopy for a personal hx of colon polyps. The patient denies abdominal pain, abnormal weight loss or rectal bleeding.      Current Facility-Administered Medications:  .  0.9 %  sodium chloride infusion, , Intravenous, Continuous, [place], Benay Pike, MD, Last Rate: 20 mL/hr at 05/29/20 6415, Continued from Pre-op at 05/29/20 0805  Medications Prior to Admission  Medication Sig Dispense Refill Last Dose  . ezetimibe-simvastatin (VYTORIN) 10-20 MG tablet Take 1 tablet by mouth daily.   05/29/2020 at Unknown time  . levothyroxine (SYNTHROID) 125 MCG tablet Take 100 mcg by mouth daily before breakfast.   05/29/2020 at Unknown time  . losartan-hydrochlorothiazide (HYZAAR) 100-12.5 MG tablet Take 1 tablet by mouth daily.   05/29/2020 at Unknown time  . metFORMIN (GLUCOPHAGE) 500 MG tablet Take by mouth daily.   05/28/2020 at Unknown time  . metoprolol succinate (TOPROL-XL) 50 MG 24 hr tablet Take 50 mg by mouth daily. Take with or immediately following a meal.   05/28/2020 at Unknown time  . pantoprazole (PROTONIX) 40 MG tablet Take 40 mg by mouth daily.   05/29/2020 at Unknown time  . pioglitazone (ACTOS) 15 MG tablet Take 15 mg by mouth daily.   05/28/2020 at Unknown time  . simvastatin (ZOCOR) 40 MG tablet Take 40 mg by mouth daily.   05/29/2020 at Unknown time  . aspirin EC 81 MG tablet Take 81 mg by mouth daily. (Patient not taking: Reported on 05/29/2020)   Not Taking at Unknown time  . omeprazole (PRILOSEC) 40 MG capsule Take 40 mg by mouth daily.        No Known Allergies   Past Medical History:  Diagnosis Date  . Cancer ([place])    melanoma  . Coronary artery disease   . Diabetes mellitus without  complication ([place])   . Dizziness and giddiness   . Hyperlipidemia   . Hypertension   . Hypothyroidism   . Obesity   . Osteoarthritis of knee   . SOB (shortness of breath)     Review of systems:  Otherwise negative.    Physical Exam  Gen: Alert, oriented. Appears stated age.  HEENT: /AT. PERRLA. Lungs: CTA, no wheezes. CV: RR nl S1, S2. Abd: soft, benign, no masses. BS+ Ext: No edema. Pulses 2+    Planned procedures: Proceed with colonoscopy. The patient understands the nature of the planned procedure, indications, risks, alternatives and potential complications including but not limited to bleeding, infection, perforation, damage to internal organs and possible oversedation/side effects from anesthesia. The patient agrees and gives consent to proceed.  Please refer to procedure notes for findings, recommendations and patient disposition/instructions.     Teodoro K. Alice Reichert, M.D. Gastroenterology 05/29/2020  8:21 AM

## 2020-05-29 NOTE — Interval H&P Note (Signed)
History and Physical Interval Note:  05/29/2020 8:21 AM  Amber Hines  has presented today for surgery, with the diagnosis of HX.OF COLON POLYPS,VOMITING.  The various methods of treatment have been discussed with the patient and family. After consideration of risks, benefits and other options for treatment, the patient has consented to  Procedure(s): COLONOSCOPY WITH PROPOFOL (N/A) ESOPHAGOGASTRODUODENOSCOPY (EGD) WITH PROPOFOL (N/A) as a surgical intervention.  The patient's history has been reviewed, patient examined, no change in status, stable for surgery.  I have reviewed the patient's chart and labs.  Questions were answered to the patient's satisfaction.     [place], [place]

## 2020-05-29 NOTE — Transfer of Care (Signed)
Immediate Anesthesia Transfer of Care Note  Patient: Amber Hines  Procedure(s) Performed: COLONOSCOPY WITH PROPOFOL (N/A ) ESOPHAGOGASTRODUODENOSCOPY (EGD) WITH PROPOFOL (N/A )  Patient Location: PACU  Anesthesia Type:General  Level of Consciousness: awake and alert   Airway & Oxygen Therapy: Patient Spontanous Breathing and Patient connected to nasal cannula oxygen  Post-op Assessment: Report given to RN and Post -op Vital signs reviewed and stable  Post vital signs: Reviewed and stable  Last Vitals:  Vitals Value Taken Time  BP 90/57 05/29/20 0858  Temp 36.5 C 05/29/20 0856  Pulse 81 05/29/20 0858  Resp 19 05/29/20 0858  SpO2 100 % 05/29/20 0858  Vitals shown include unvalidated device data.  Last Pain:  Vitals:   05/29/20 0856  TempSrc: Temporal  PainSc: Asleep         Complications: No complications documented.

## 2020-05-29 NOTE — Op Note (Signed)
[facility] Gastroenterology Patient Name: Amber Hines Procedure Date: 05/29/2020 7:46 AM MRN: [id number] Account #: [id number] Date of Birth: [birth year] Admit Type: Outpatient Age: 75 Room: [facility] ENDO ROOM 2 Gender: Female Note Status: Finalized Procedure:             Upper GI endoscopy Indications:           Nausea with vomiting Providers:             Benay Pike. Alice Reichert MD, MD Referring MD:          Leonie Douglas. Doy Hutching, MD (Referring MD) Medicines:             Propofol per Anesthesia Complications:         No immediate complications. Procedure:             Pre-Anesthesia Assessment:                        - The risks and benefits of the procedure and the                         sedation options and risks were discussed with the                         patient. All questions were answered and informed                         consent was obtained.                        - Patient identification and proposed procedure were                         verified prior to the procedure by the nurse. The                         procedure was verified in the procedure room.                        - ASA Grade Assessment: II - A patient with mild                         systemic disease.                        - After reviewing the risks and benefits, the patient                         was deemed in satisfactory condition to undergo the                         procedure.                        After obtaining informed consent, the endoscope was                         passed under direct vision. Throughout the procedure,                         the patient's blood pressure,  pulse, and oxygen                         saturations were monitored continuously. The Endoscope                         was introduced through the mouth, and advanced to the                         third part of duodenum. The upper GI endoscopy was                         accomplished without difficulty.  The patient tolerated                         the procedure well. Findings:      LA Grade A (one or more mucosal breaks less than 5 mm, not extending       between tops of 2 mucosal folds) esophagitis with no bleeding was found       in the distal esophagus.      The Z-line was irregular and was found 35 to 36 cm from the incisors.       Mucosa was biopsied with a cold forceps for histology. One specimen       bottle was sent to pathology.      A 2 cm hiatal hernia was present.      Localized mildly erythematous mucosa without bleeding was found in the       gastric antrum.      The examined duodenum was normal.      The exam was otherwise without abnormality. Impression:            - LA Grade A reflux esophagitis with no bleeding.                        - Z-line irregular, 35 to 36 cm from the incisors.                         Biopsied.                        - 2 cm hiatal hernia.                        - Erythematous mucosa in the antrum.                        - Normal examined duodenum.                        - The examination was otherwise normal. Recommendation:        - Await pathology results.                        - Proceed with colonoscopy Procedure Code(s):     --- Professional ---                        [phone], Esophagogastroduodenoscopy, flexible,                         transoral; with  biopsy, single or multiple Diagnosis Code(s):     --- Professional ---                        R11.2, Nausea with vomiting, unspecified                        K31.89, Other diseases of stomach and duodenum                        K44.9, Diaphragmatic hernia without obstruction or                         gangrene                        K22.8, Other specified diseases of esophagus                        K21.00, Gastro-esophageal reflux disease with                         esophagitis, without bleeding CPT copyright 2019 American Medical Association. All rights reserved. The codes documented  in this report are preliminary and upon coder review may  be revised to meet current compliance requirements. Efrain Sella MD, MD 05/29/2020 8:39:06 AM This report has been signed electronically. Number of Addenda: 0 Note Initiated On: 05/29/2020 7:46 AM Estimated Blood Loss:  Estimated blood loss: none.      [facility]

## 2020-05-29 NOTE — Op Note (Signed)
[facility] Gastroenterology Patient Name: Amber Hines Procedure Date: 05/29/2020 7:45 AM MRN: [id number] Account #: [id number] Date of Birth: 1946-03-01 Admit Type: Outpatient Age: 75 Room: [facility] ENDO ROOM 2 Gender: Female Note Status: Finalized Procedure:             Colonoscopy Indications:           Surveillance: Personal history of adenomatous polyps                         on last colonoscopy > 5 years ago Providers:             Lorie Apley K. Lonni Dirden MD, MD Medicines:             Propofol per Anesthesia Complications:         No immediate complications. Procedure:             Pre-Anesthesia Assessment:                        - The risks and benefits of the procedure and the                         sedation options and risks were discussed with the                         patient. All questions were answered and informed                         consent was obtained.                        - Patient identification and proposed procedure were                         verified prior to the procedure by the nurse. The                         procedure was verified in the procedure room.                        - ASA Grade Assessment: III - A patient with severe                         systemic disease.                        - After reviewing the risks and benefits, the patient                         was deemed in satisfactory condition to undergo the                         procedure.                        After obtaining informed consent, the colonoscope was                         passed under direct vision. Throughout the procedure,  the patient's blood pressure, pulse, and oxygen                         saturations were monitored continuously. The                         Colonoscope was introduced through the anus and                         advanced to the the cecum, identified by appendiceal                         orifice and  ileocecal valve. The colonoscopy was                         performed without difficulty. The patient tolerated                         the procedure well. The quality of the bowel                         preparation was adequate. The ileocecal valve,                         appendiceal orifice, and rectum were photographed. Findings:      The perianal and digital rectal examinations were normal. Pertinent       negatives include normal sphincter tone and no palpable rectal lesions.      Non-bleeding internal hemorrhoids were found during retroflexion. The       hemorrhoids were Grade I (internal hemorrhoids that do not prolapse).      Many small and large-mouthed diverticula were found in the sigmoid colon       and transverse colon.      A 5 mm polyp was found in the sigmoid colon. The polyp was sessile. The       polyp was removed with a jumbo cold forceps. Resection and retrieval       were complete.      There was a medium-sized lipoma, 30 mm in diameter, in the distal       transverse colon. Biopsies were taken with a cold forceps for histology.      The exam was otherwise without abnormality. Impression:            - Non-bleeding internal hemorrhoids.                        - Diverticulosis in the sigmoid colon and in the                         transverse colon.                        - One 5 mm polyp in the sigmoid colon, removed with a                         jumbo cold forceps. Resected and retrieved.                        - Medium-sized lipoma in the distal transverse colon.  Biopsied.                        - The examination was otherwise normal. Recommendation:        - Patient has a contact number available for                         emergencies. The signs and symptoms of potential                         delayed complications were discussed with the patient.                         Return to normal activities tomorrow. Written                          discharge instructions were provided to the patient.                        - Resume previous diet.                        - Continue present medications.                        - Await pathology results.                        - Repeat colonoscopy in 5 years for surveillance.                        - Return to GI office PRN.                        - The findings and recommendations were discussed with                         the patient. Procedure Code(s):     --- Professional ---                        [phone], Colonoscopy, flexible; with biopsy, single or                         multiple Diagnosis Code(s):     --- Professional ---                        K57.30, Diverticulosis of large intestine without                         perforation or abscess without bleeding                        D17.5, Benign lipomatous neoplasm of intra-abdominal                         organs                        K63.5, Polyp of colon  K64.0, First degree hemorrhoids                        Z86.010, Personal history of colonic polyps CPT copyright 2019 American Medical Association. All rights reserved. The codes documented in this report are preliminary and upon coder review may  be revised to meet current compliance requirements. Efrain Sella MD, MD 05/29/2020 8:57:03 AM This report has been signed electronically. Number of Addenda: 0 Note Initiated On: 05/29/2020 7:45 AM Scope Withdrawal Time: 0 hours 7 minutes 25 seconds  Total Procedure Duration: 0 hours 11 minutes 56 seconds  Estimated Blood Loss:  Estimated blood loss: none. Estimated blood loss: none.      [facility]

## 2020-05-29 NOTE — Anesthesia Postprocedure Evaluation (Signed)
Anesthesia Post Note  Patient: Amber Hines  Procedure(s) Performed: COLONOSCOPY WITH PROPOFOL (N/A ) ESOPHAGOGASTRODUODENOSCOPY (EGD) WITH PROPOFOL (N/A )  Patient location during evaluation: Phase II Anesthesia Type: General Level of consciousness: awake and alert, awake and oriented Pain management: pain level controlled Vital Signs Assessment: post-procedure vital signs reviewed and stable Respiratory status: spontaneous breathing, nonlabored ventilation and respiratory function stable Cardiovascular status: blood pressure returned to baseline and stable Postop Assessment: no apparent nausea or vomiting Anesthetic complications: no   No complications documented.   Last Vitals:  Vitals:   05/29/20 0906 05/29/20 0916  BP: (!) 96/56 (!) 98/57  Pulse:  62  Resp: 16 15  Temp:    SpO2:  98%    Last Pain:  Vitals:   05/29/20 0856  TempSrc: Temporal  PainSc: Asleep                 Phill Mutter

## 2020-05-29 NOTE — Anesthesia Preprocedure Evaluation (Signed)
Anesthesia Evaluation  Patient identified by MRN, date of birth, ID band Patient awake    Reviewed: Allergy & Precautions, H&P , NPO status , Patient's Chart, lab work & pertinent test results  History of Anesthesia Complications Negative for: history of anesthetic complications  Airway Mallampati: III  TM Distance: >3 FB Neck ROM: limited    Dental no notable dental hx. (+) Teeth Intact   Pulmonary neg pulmonary ROS, neg shortness of breath,    Pulmonary exam normal breath sounds clear to auscultation       Cardiovascular Exercise Tolerance: Good hypertension, Pt. on medications (-) angina+ CAD  (-) Past MI and (-) DOE Normal cardiovascular exam Rhythm:regular Rate:Normal     Neuro/Psych negative neurological ROS  negative psych ROS   GI/Hepatic negative GI ROS, Neg liver ROS,   Endo/Other  diabetes, Type 2Hypothyroidism Morbid obesity  Renal/GU negative Renal ROS  negative genitourinary   Musculoskeletal  (+) Arthritis , Osteoarthritis,    Abdominal   Peds  Hematology negative hematology ROS (+)   Anesthesia Other Findings . Benign hypertension  . Hyperlipidemia  . Hyperplastic colon polyp 04/05/2015  . Hypothyroidism  . Obesity  . Osteoarthritis of knee  . Serrated adenoma of colon, unspecified 04/05/2015  . Squamous acanthoma of face  . Type 2 diabetes mellitus (CMS-HCC) GERD HTN Hypercholesterolemia      Reproductive/Obstetrics negative OB ROS                            Anesthesia Physical  Anesthesia Plan  ASA: III  Anesthesia Plan: General   Post-op Pain Management:    Induction:   PONV Risk Score and Plan: Propofol infusion and TIVA  Airway Management Planned: Natural Airway and Nasal Cannula  Additional Equipment:   Intra-op Plan:   Post-operative Plan:   Informed Consent: I have reviewed the patients History and Physical, chart, labs and discussed  the procedure including the risks, benefits and alternatives for the proposed anesthesia with the patient or authorized representative who has indicated his/her understanding and acceptance.       Plan Discussed with: Anesthesiologist, CRNA and Surgeon  Anesthesia Plan Comments:         Anesthesia Quick Evaluation

## 2020-05-30 ENCOUNTER — Encounter: Payer: Self-pay | Admitting: Internal Medicine

## 2020-05-30 LAB — SURGICAL PATHOLOGY

## 2021-10-30 IMAGING — MG DIGITAL DIAGNOSTIC BILAT W/ TOMO W/ CAD
8 series · 8 of 24 positions shown · non-contrast
Comparison: Previous exam(s).

CLINICAL DATA: Patient presents for bilateral diagnostic
examination to follow-up a probable benign mass over the outer mid
mammogram.

EXAM:
DIGITAL DIAGNOSTIC BILATERAL MAMMOGRAM WITH CAD AND TOMO

[R CC synth-2D]
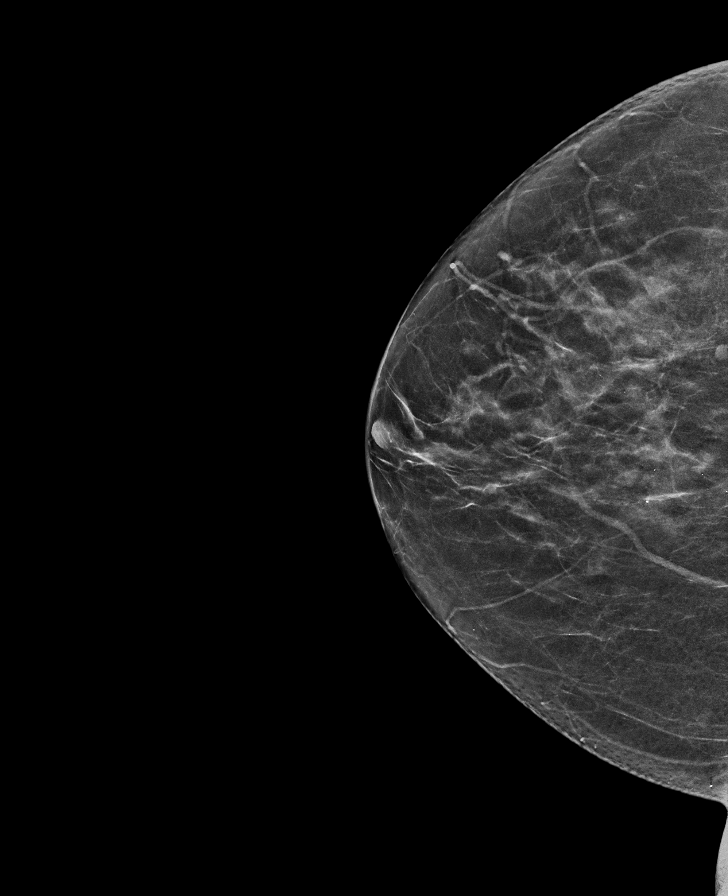

[R MLO synth-2D]
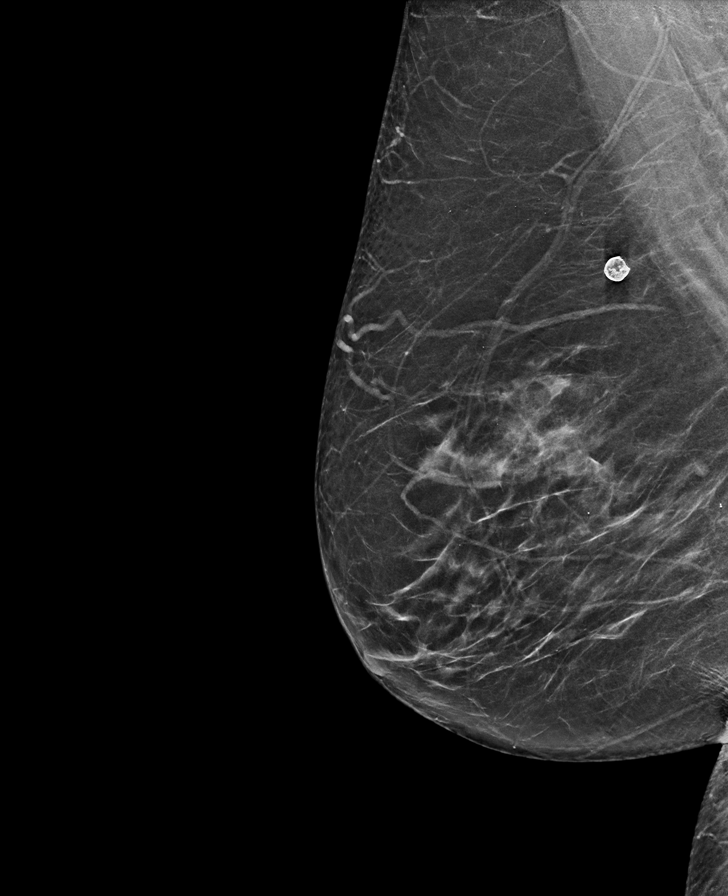

[L MLO synth-2D]
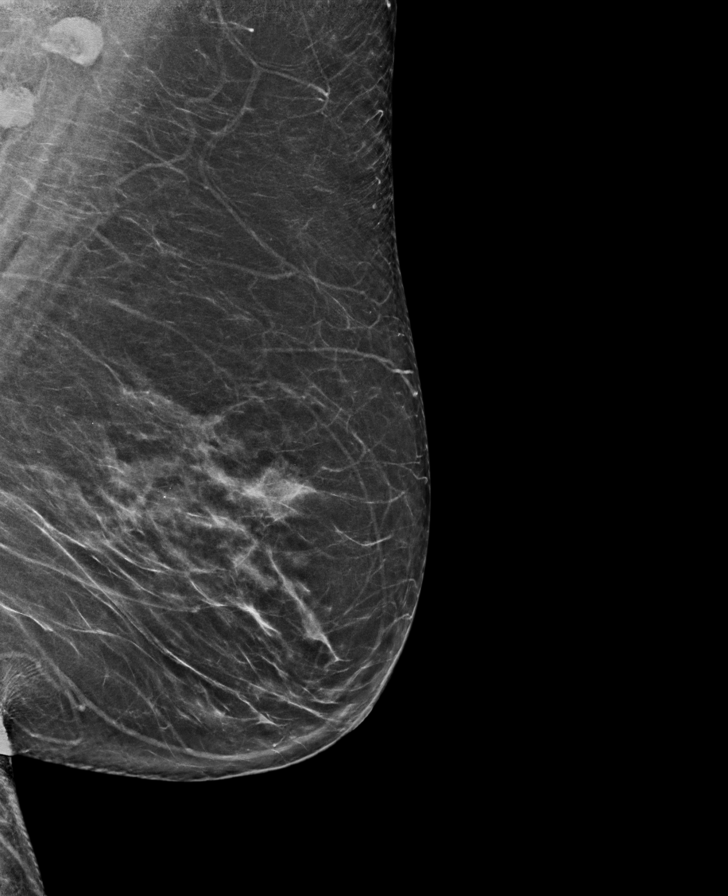

[L CC synth-2D]
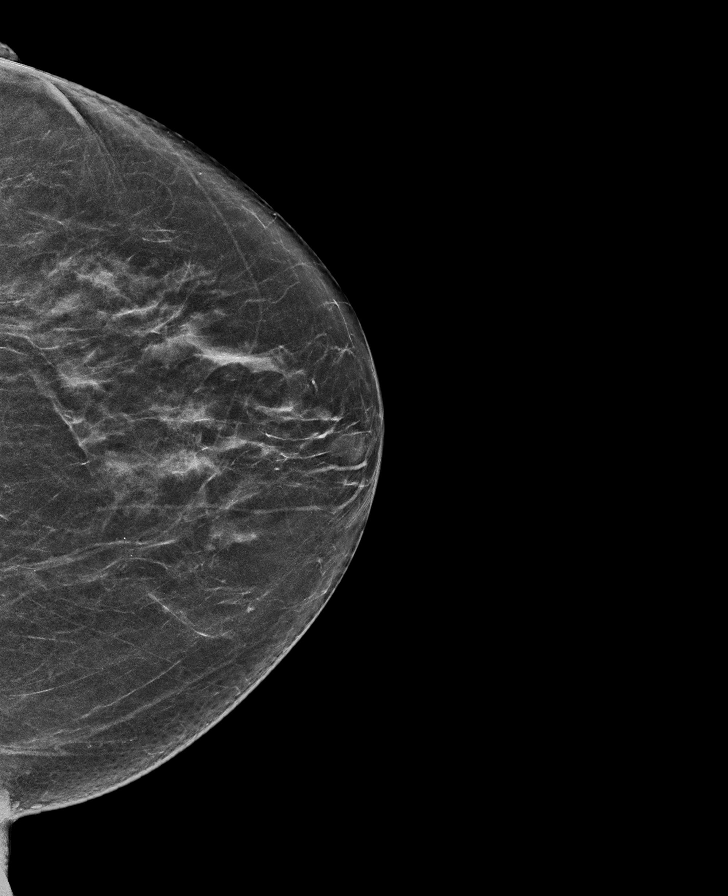

[R MLO tomo · tomo slice 33/66.0]
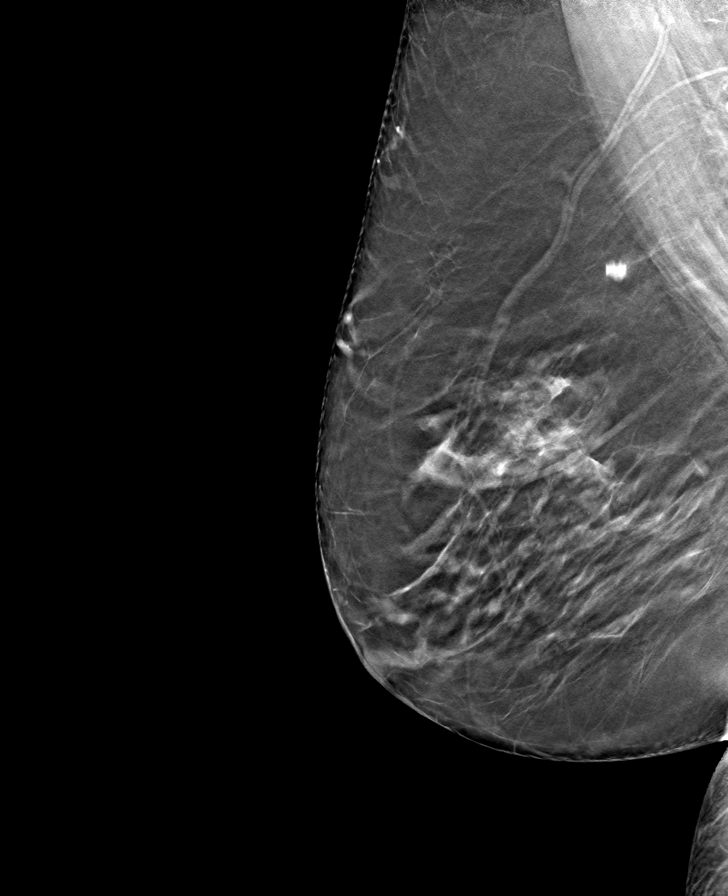

[R CC tomo · tomo slice 29/57.0]
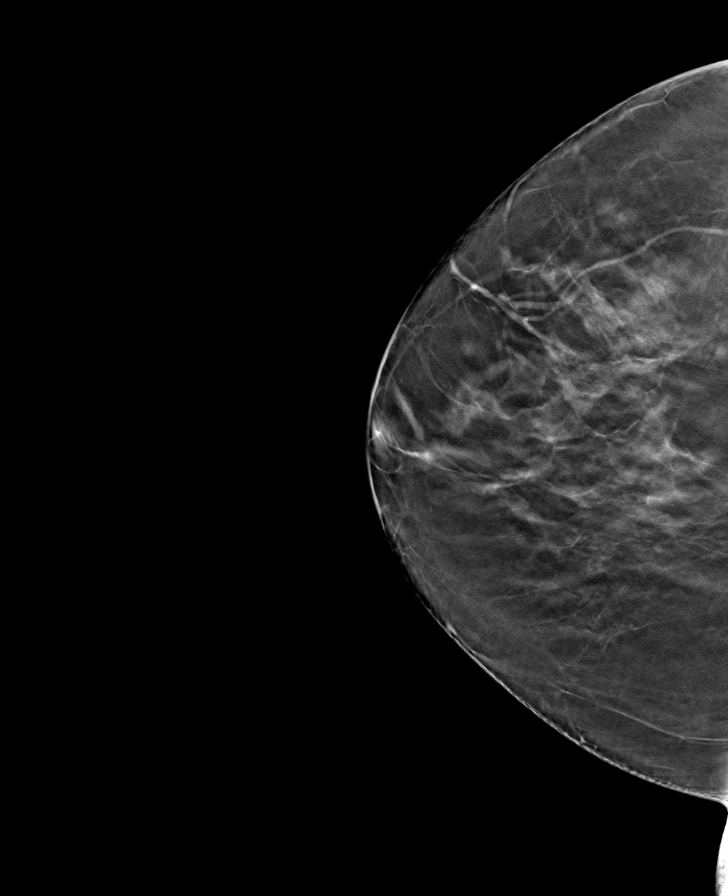

[L CC tomo · tomo slice 34/67.0]
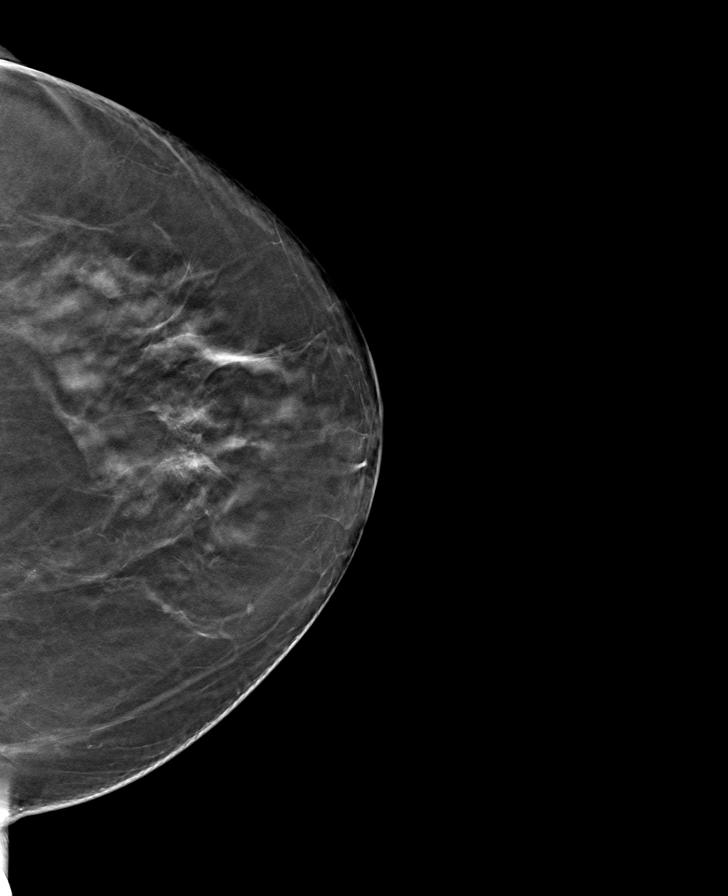

[L MLO tomo · tomo slice 35/68.0]
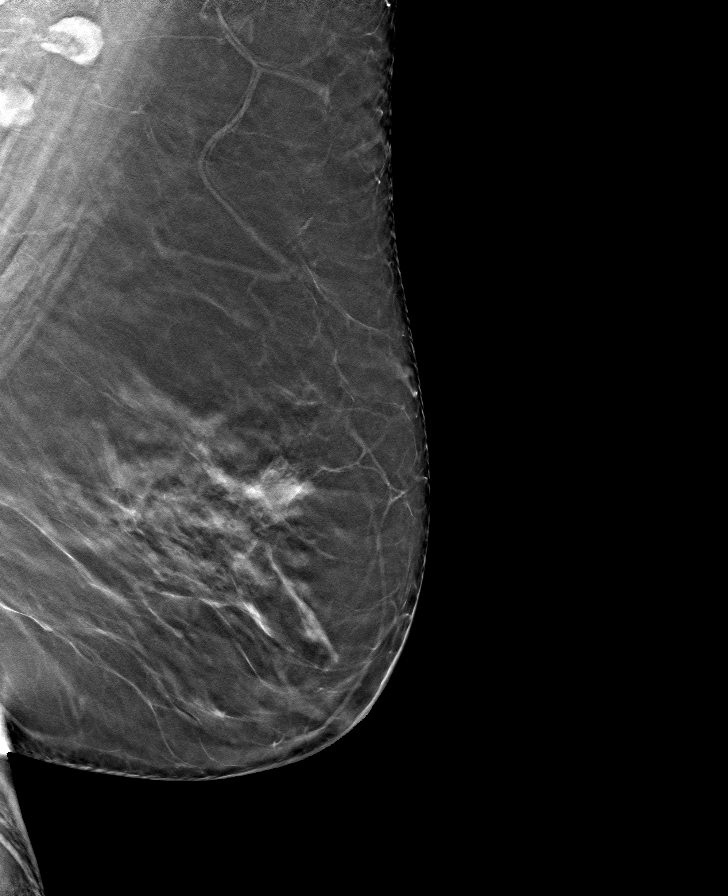

[8 of 24 positions shown; findings below may reference images not displayed]

ACR Breast Density Category b: There are scattered areas of
fibroglandular density.
FINDINGS: Examination demonstrates no change in oval circumscribed 4 mm mass
over the posterior third of the outer mid to lower right breast.
Remainder of the right breast as well as the left breast is
unchanged.

Mammographic images were processed with CAD.
IMPRESSION: 4 mm mass over the outer mid to lower right breast unchanged for 2
years and therefore considered benign.

RECOMMENDATION:
Recommend continued annual bilateral screening mammographic
follow-up.

I have discussed the findings and recommendations with the patient.
If applicable, a reminder letter will be sent to the patient
regarding the next appointment.

BI-RADS CATEGORY  2: Benign.

## 2022-05-05 ENCOUNTER — Other Ambulatory Visit: Payer: Self-pay | Admitting: General Surgery

## 2022-05-05 DIAGNOSIS — K2100 Gastro-esophageal reflux disease with esophagitis, without bleeding: Secondary | ICD-10-CM

## 2022-05-05 DIAGNOSIS — R1314 Dysphagia, pharyngoesophageal phase: Secondary | ICD-10-CM

## 2022-05-05 DIAGNOSIS — K449 Diaphragmatic hernia without obstruction or gangrene: Secondary | ICD-10-CM

## 2022-05-05 DIAGNOSIS — R634 Abnormal weight loss: Secondary | ICD-10-CM

## 2022-05-05 DIAGNOSIS — R0982 Postnasal drip: Secondary | ICD-10-CM

## 2022-05-05 DIAGNOSIS — R1013 Epigastric pain: Secondary | ICD-10-CM

## 2022-05-14 ENCOUNTER — Ambulatory Visit
Admission: RE | Admit: 2022-05-14 | Discharge: 2022-05-14 | Disposition: A | Discharge status: Home / Self Care | Payer: Medicare HMO | Source: Ambulatory Visit | Attending: General Surgery | Admitting: General Surgery

## 2022-05-14 DIAGNOSIS — R0982 Postnasal drip: Secondary | ICD-10-CM

## 2022-05-14 DIAGNOSIS — K449 Diaphragmatic hernia without obstruction or gangrene: Secondary | ICD-10-CM

## 2022-05-14 DIAGNOSIS — R1314 Dysphagia, pharyngoesophageal phase: Secondary | ICD-10-CM | POA: Diagnosis present

## 2022-05-14 DIAGNOSIS — K2100 Gastro-esophageal reflux disease with esophagitis, without bleeding: Secondary | ICD-10-CM | POA: Diagnosis present

## 2022-05-14 DIAGNOSIS — R634 Abnormal weight loss: Secondary | ICD-10-CM | POA: Diagnosis present

## 2022-05-14 DIAGNOSIS — R1013 Epigastric pain: Secondary | ICD-10-CM

## 2022-08-29 IMAGING — US US ABDOMEN COMPLETE
1 series · 14 of 25 positions shown · non-contrast
Comparison: None.

CLINICAL DATA: Acute renal failure

EXAM:
ABDOMEN ULTRASOUND COMPLETE

[Series 1: us abdomen complete · 0.33mm/px · 14 of 66 slices shown]
[im 1/66]
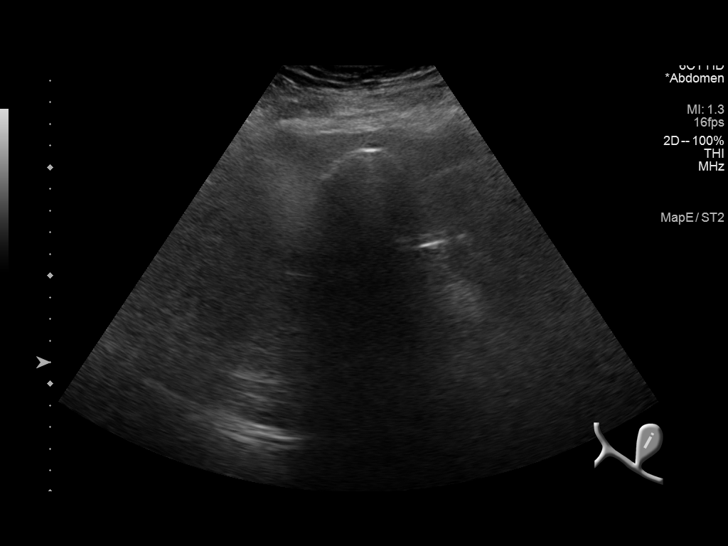
[im 6/66]
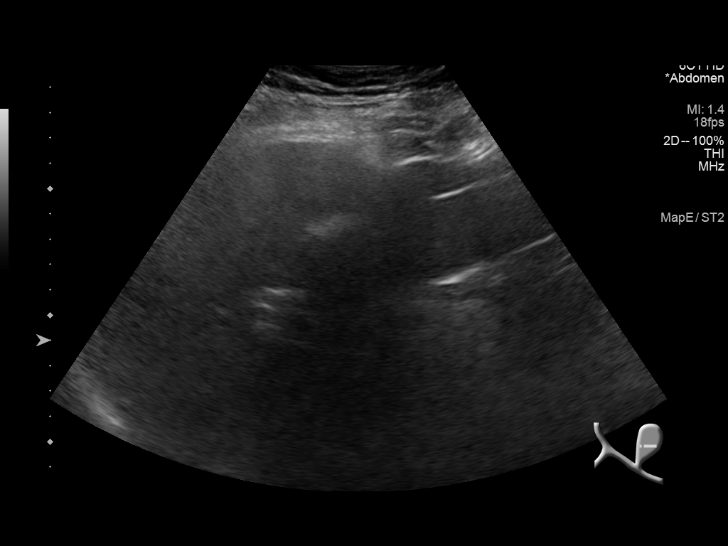
[im 11/66]
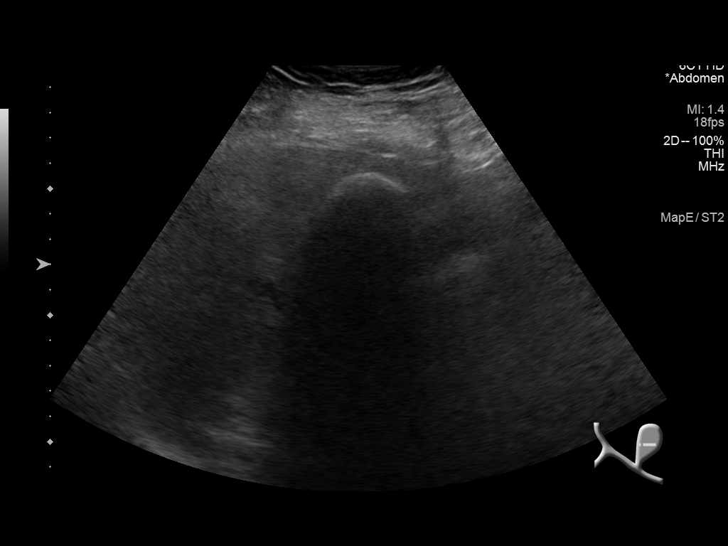
[im 17/66]
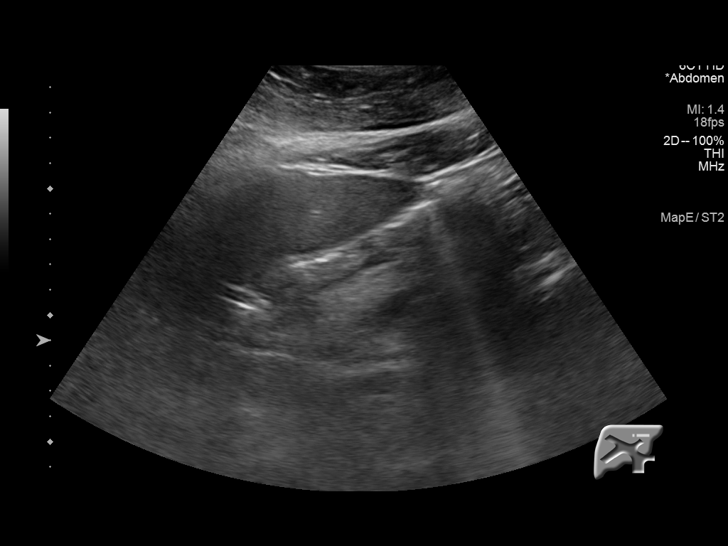
[im 22/66]
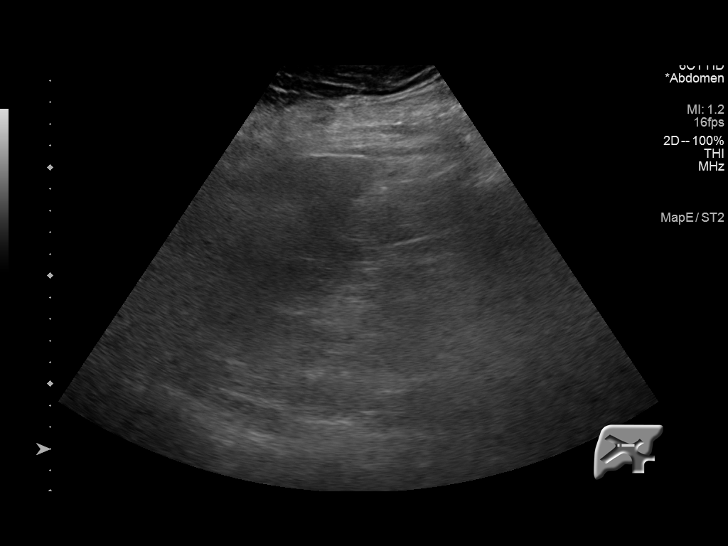
[im 25/66]
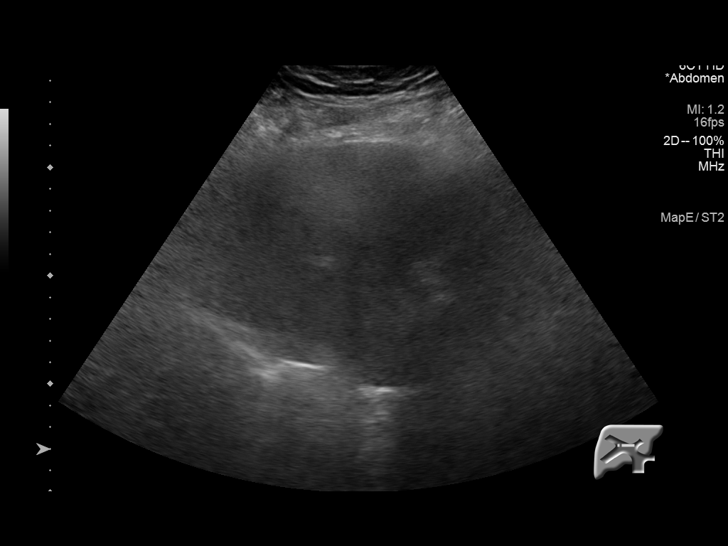
[im 30/66]
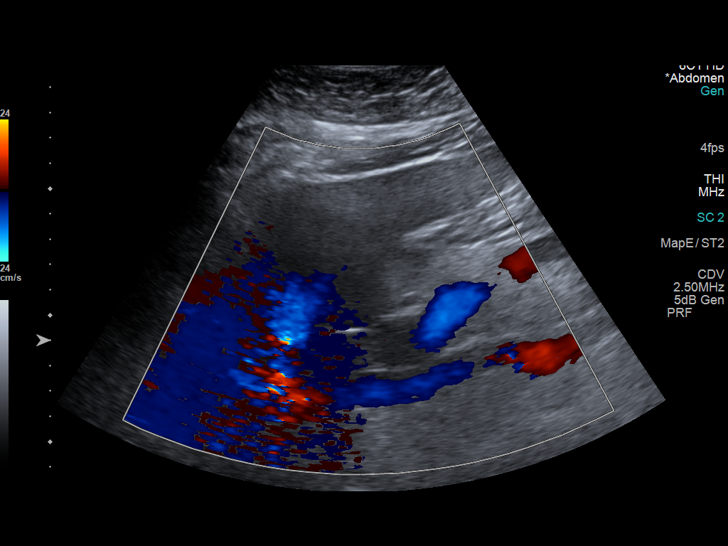
[im 36/66]
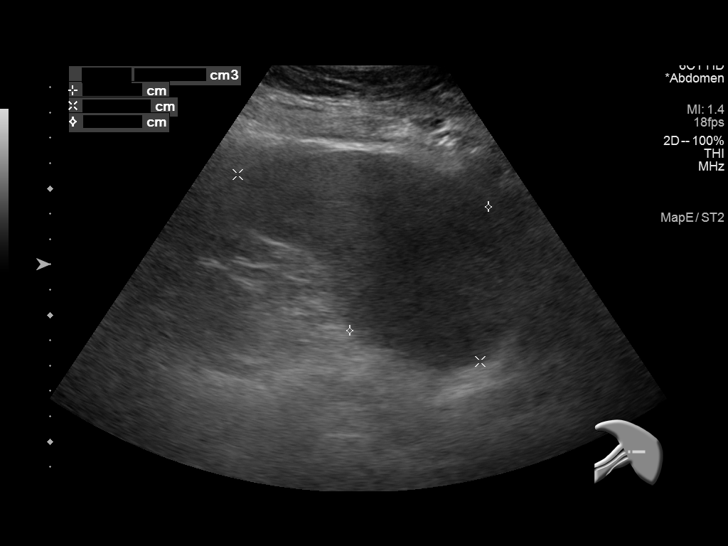
[im 41/66]
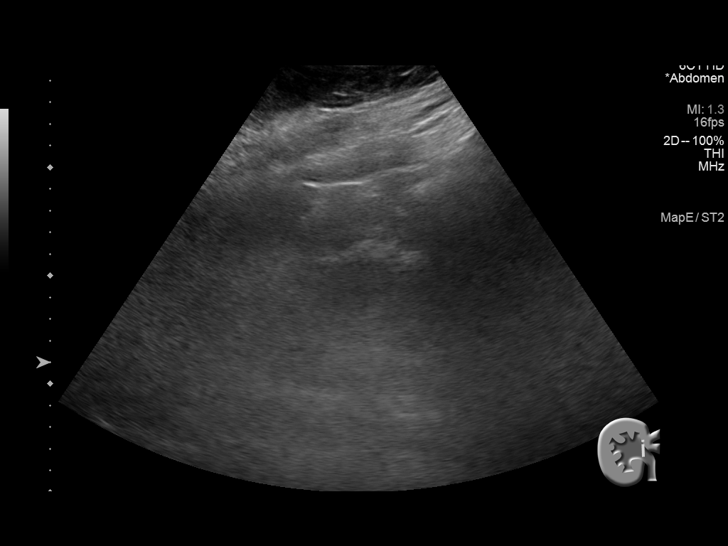
[im 44/66]
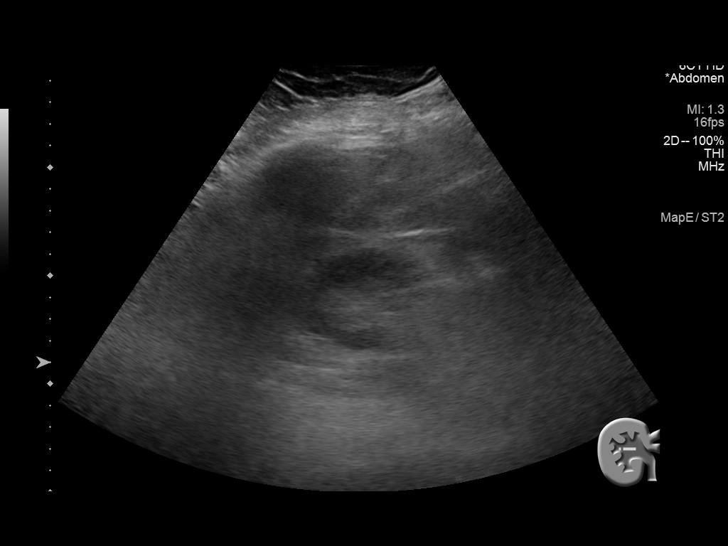
[im 49/66]
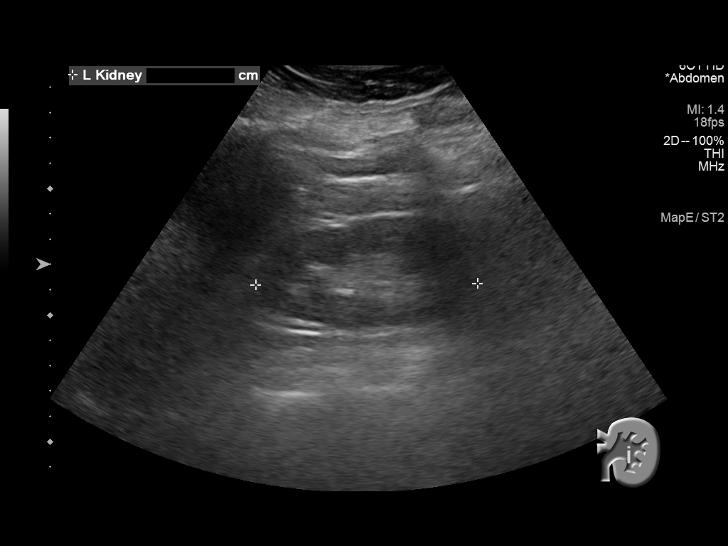
[im 55/66]
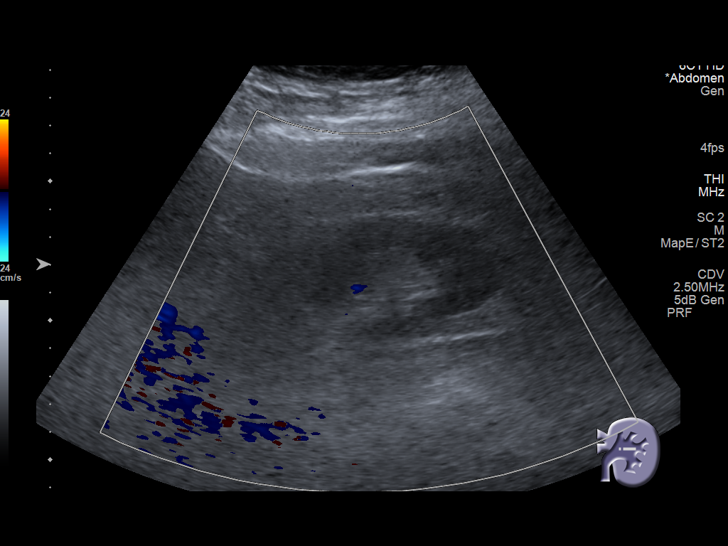
[im 60/66]
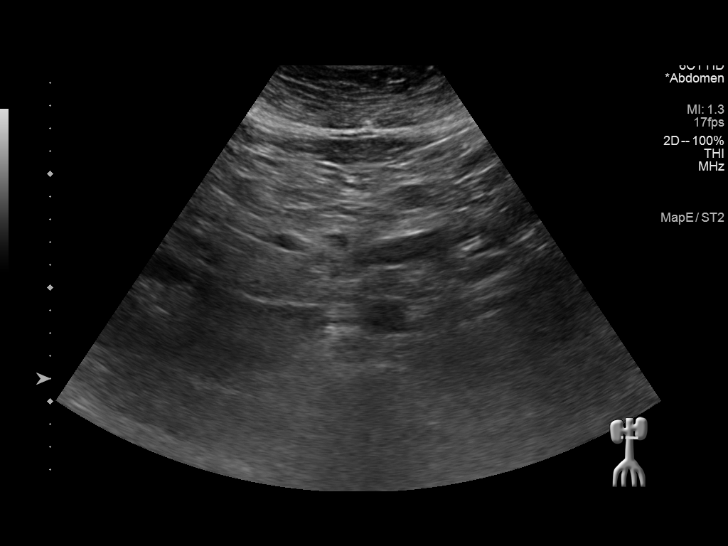
[im 66/66]
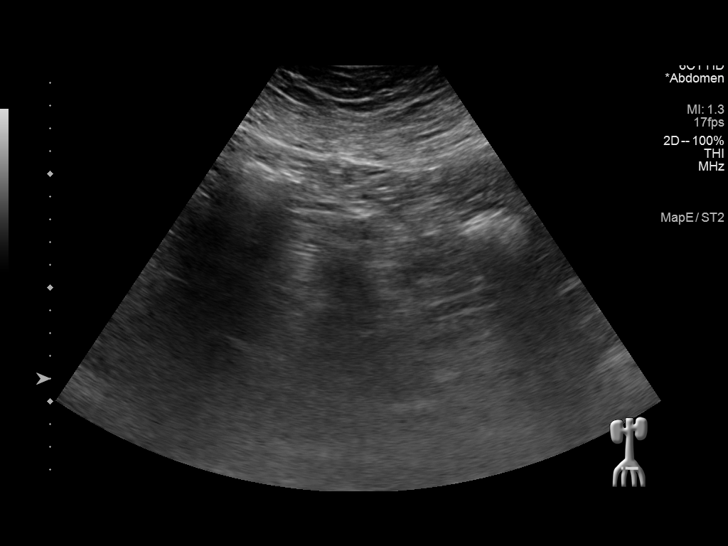

[14 of 25 positions shown; findings below may reference images not displayed]

FINDINGS: Gallbladder: Shadowing throughout likely reflecting numerous stones.
No wall thickening visualized. No sonographic Murphy sign noted by
sonographer.

Common bile duct: Diameter: 5 mm, within normal limits

Liver: No focal lesion identified. Increased parenchymal
echogenicity. Portal vein is patent on color Doppler imaging with
normal direction of blood flow towards the liver.

IVC: No abnormality visualized.

Pancreas: Visualized portion unremarkable.

Spleen: Size and appearance within normal limits.

Right Kidney: Length: 11.3 cm. Echogenicity within normal limits. No
mass or hydronephrosis visualized.

Left Kidney: Length: 8.9 cm. Echogenicity within normal limits. No
mass or hydronephrosis visualized.

Abdominal aorta: No aneurysm.  Suboptimal visualization.

Other findings: Suboptimal evaluation due to body habitus.
IMPRESSION: Technically suboptimal evaluation.

No hydronephrosis.

Cholelithiasis.

Increased liver echogenicity likely reflecting steatosis.

## 2023-03-02 ENCOUNTER — Other Ambulatory Visit: Payer: Self-pay | Admitting: Internal Medicine

## 2023-03-02 DIAGNOSIS — Z1231 Encounter for screening mammogram for malignant neoplasm of breast: Secondary | ICD-10-CM

## 2023-03-24 ENCOUNTER — Ambulatory Visit
Admission: RE | Admit: 2023-03-24 | Discharge: 2023-03-24 | Disposition: A | Discharge status: Home / Self Care | Payer: Medicare HMO | Source: Ambulatory Visit | Attending: Internal Medicine | Admitting: Internal Medicine

## 2023-03-24 DIAGNOSIS — Z1231 Encounter for screening mammogram for malignant neoplasm of breast: Secondary | ICD-10-CM | POA: Diagnosis present

## 2023-12-03 ENCOUNTER — Other Ambulatory Visit: Payer: Self-pay | Admitting: Internal Medicine

## 2023-12-03 DIAGNOSIS — Z1231 Encounter for screening mammogram for malignant neoplasm of breast: Secondary | ICD-10-CM

## 2024-03-24 ENCOUNTER — Ambulatory Visit
Admission: RE | Admit: 2024-03-24 | Discharge: 2024-03-24 | Disposition: A | Discharge status: Home / Self Care | Source: Ambulatory Visit | Attending: Internal Medicine | Admitting: Internal Medicine

## 2024-03-24 DIAGNOSIS — Z1231 Encounter for screening mammogram for malignant neoplasm of breast: Secondary | ICD-10-CM | POA: Insufficient documentation
